# Patient Record
Sex: Female | Born: 1990 | Race: Black or African American | Hispanic: No | Marital: Single | Smoking: Never smoker
Health system: Southern US, Community
[De-identification: ages and names within clinical notes are randomized; demographics above are authoritative.]

---
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
## 2012-07-20 IMAGING — CR DG CHEST 2V
2 series · 2 of 2 positions shown · non-contrast
Comparison: None

CLINICAL DATA: Cough, fever, chest pain.

CHEST - 2 VIEW

[w chest pa]
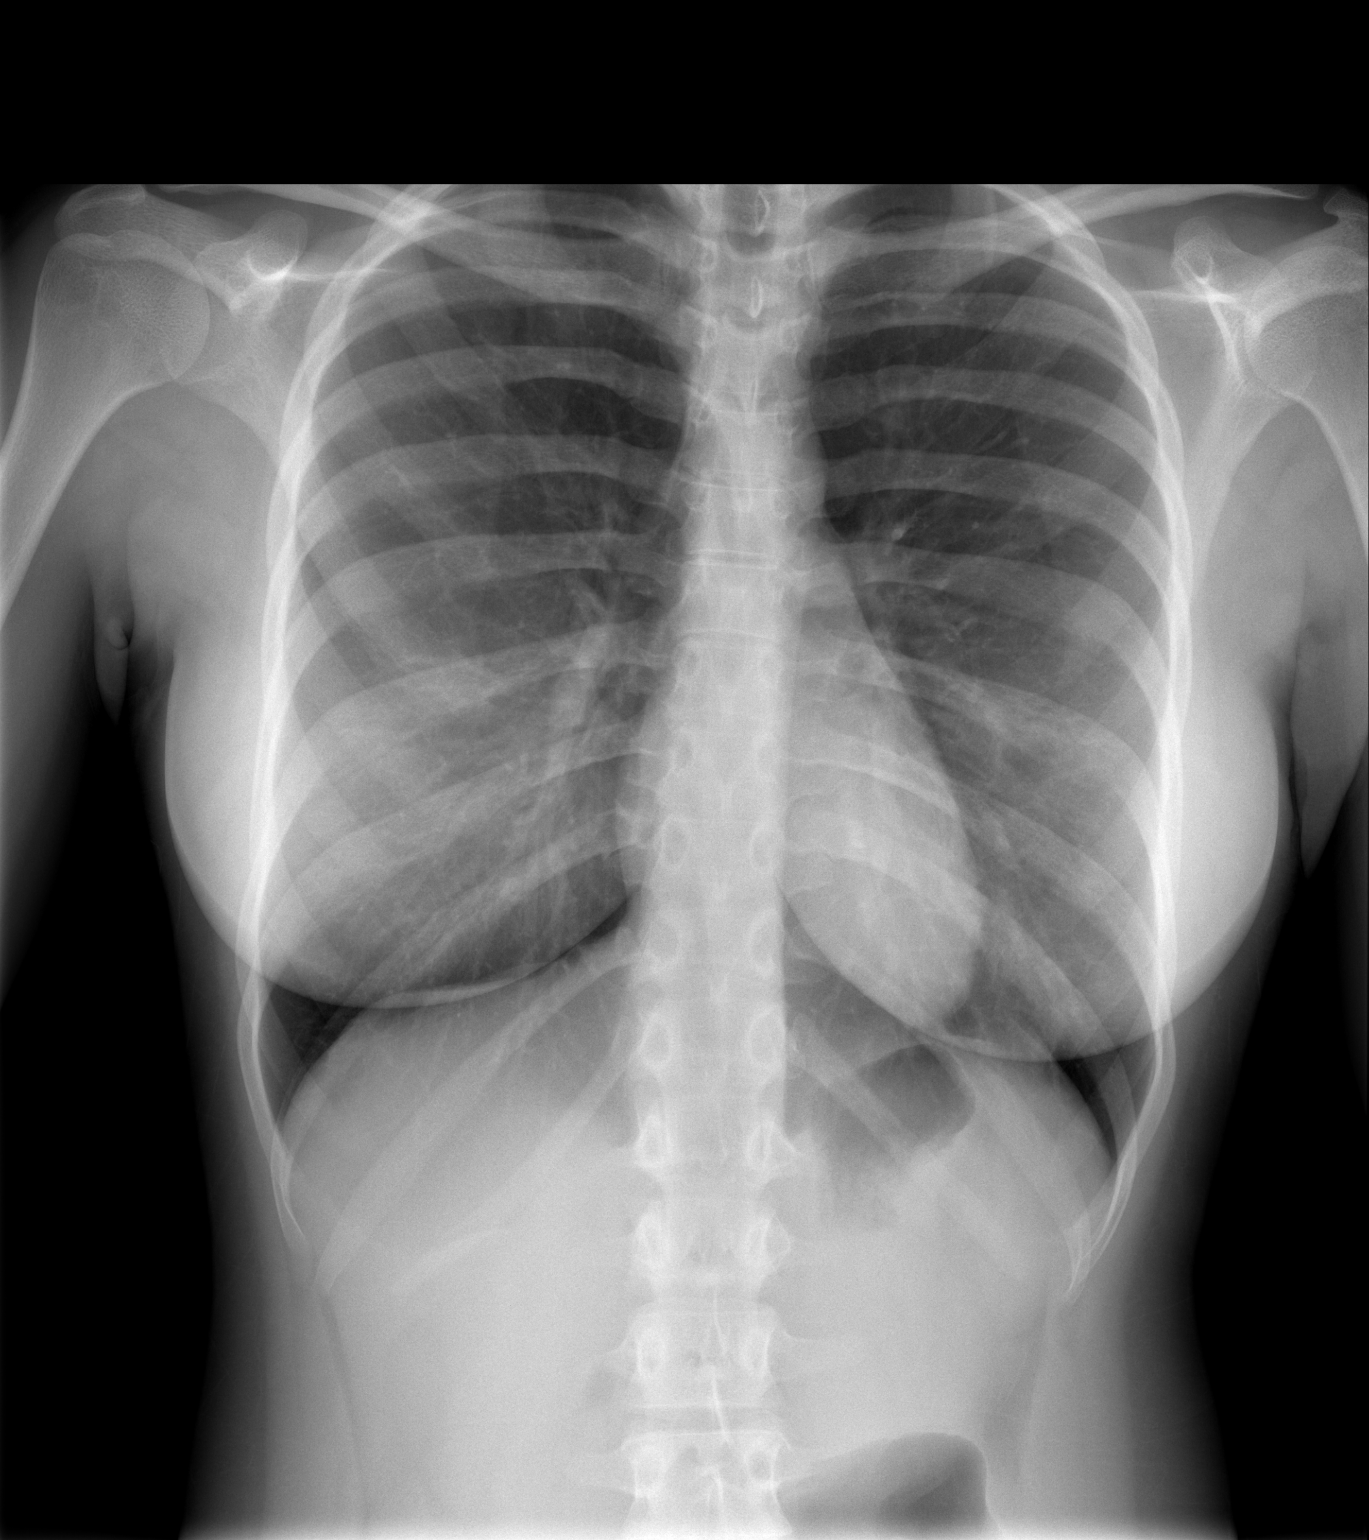

[w chest lat]
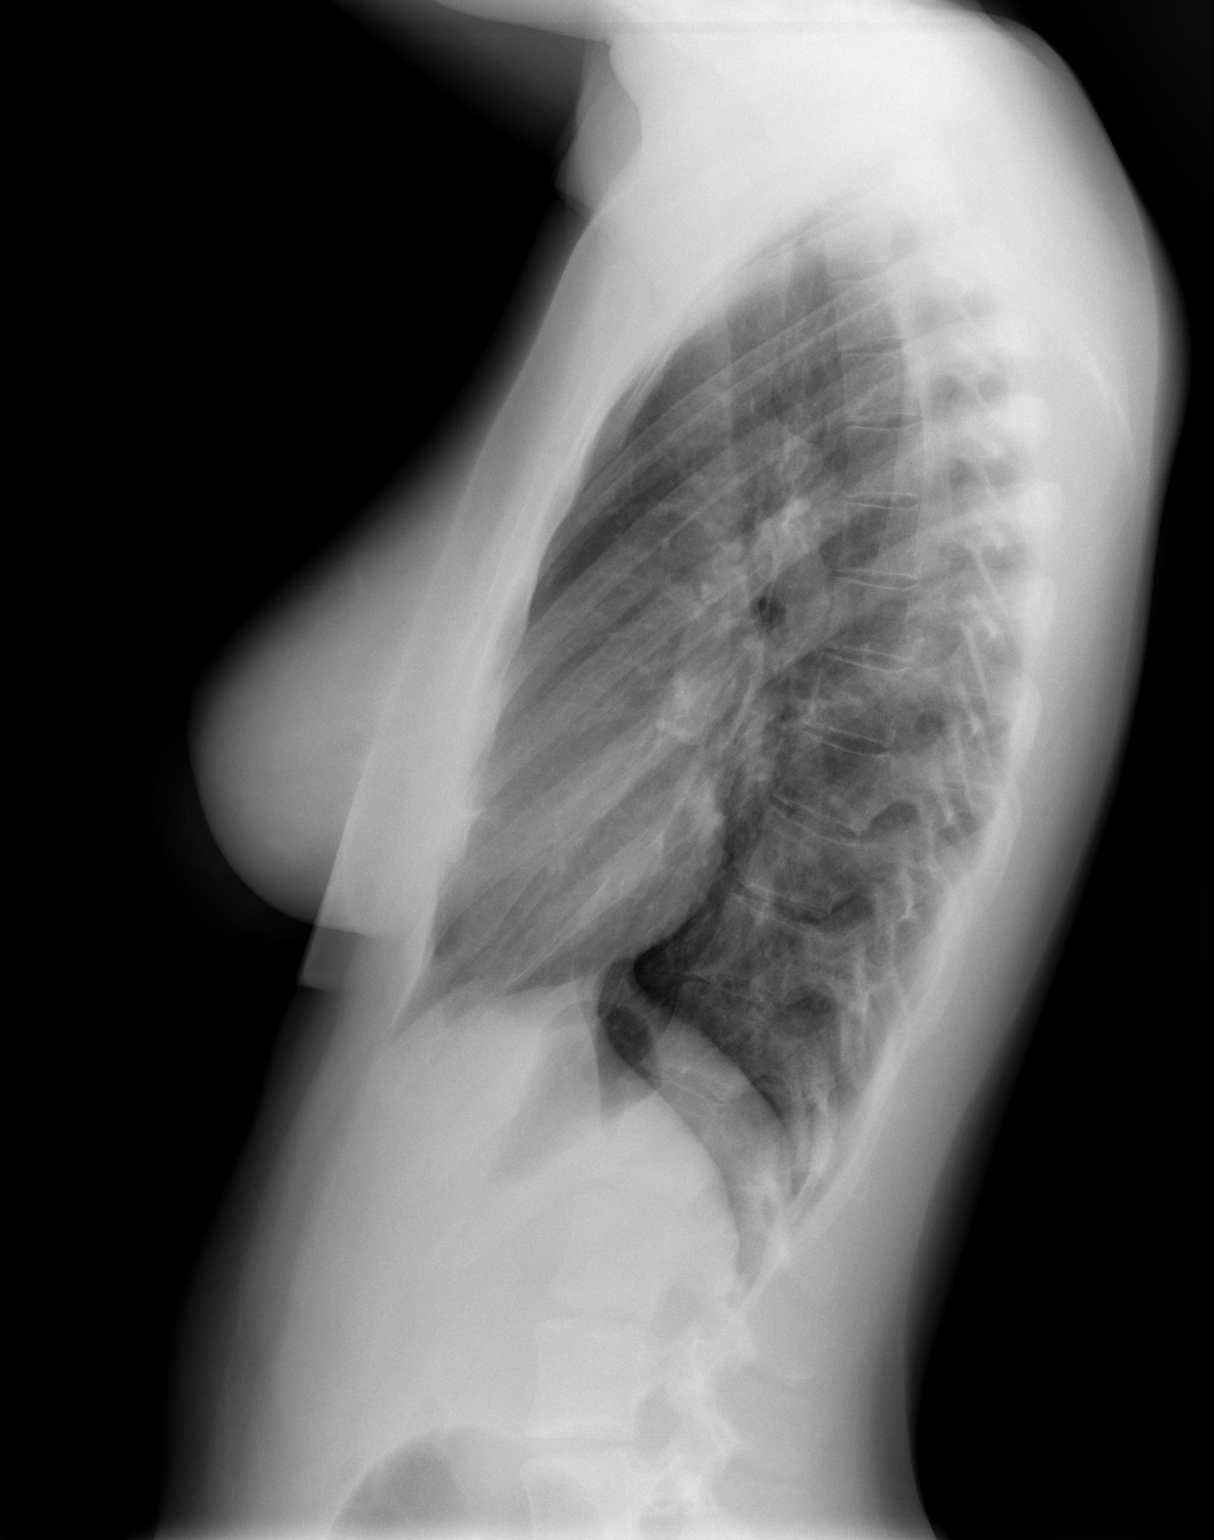

[2 of 2 positions shown; findings below may reference images not displayed]

FINDINGS: Heart and mediastinal contours are within normal limits.
No focal opacities or effusions.  No acute bony abnormality.
IMPRESSION: No active cardiopulmonary disease.

## 2016-07-06 ENCOUNTER — Encounter (HOSPITAL_COMMUNITY): Payer: Self-pay

## 2016-07-06 ENCOUNTER — Emergency Department (HOSPITAL_COMMUNITY)
Admission: EM | Admit: 2016-07-06 | Discharge: 2016-07-06 | Disposition: A | Discharge status: Home / Self Care | Payer: Self-pay | Attending: Emergency Medicine | Admitting: Emergency Medicine

## 2016-07-06 DIAGNOSIS — J069 Acute upper respiratory infection, unspecified: Secondary | ICD-10-CM | POA: Insufficient documentation

## 2016-07-06 LAB — RAPID STREP SCREEN (MED CTR MEBANE ONLY): Streptococcus, Group A Screen (Direct): NEGATIVE

## 2016-07-06 MED ORDER — IBUPROFEN 800 MG PO TABS: 800.0000 mg | ORAL_TABLET | Freq: Three times a day (TID) | ORAL | 0 refills | Status: AC

## 2016-07-06 MED ORDER — SODIUM CHLORIDE 0.9 % IV BOLUS (SEPSIS): 1000.0000 mL | Freq: Once | INTRAVENOUS | Status: DC

## 2016-07-06 MED ORDER — BENZONATATE 100 MG PO CAPS: 100.0000 mg | ORAL_CAPSULE | Freq: Three times a day (TID) | ORAL | 0 refills | Status: AC

## 2016-07-06 MED ORDER — KETOROLAC TROMETHAMINE 60 MG/2ML IM SOLN
60.0000 mg | Freq: Once | INTRAMUSCULAR | Status: AC
  Administered 2016-07-06: 60 mg via INTRAMUSCULAR
  Filled 2016-07-06: qty 2

## 2016-07-06 NOTE — Discharge Instructions (Signed)
The strep test today was negative. Your symptoms are consistent with a viral illness. Viruses do not require antibiotics. Treatment is symptomatic care and it is important to note that these symptoms may last for 7-14 days. Symptoms will be intensified and complicated by dehydration. Dehydration can also extend the duration of symptoms. Drink plenty of fluids and get plenty of rest. You should be drinking at least half a liter of water an hour to stay hydrated. Electrolyte drinks are also encouraged. You should be drinking enough fluids to make your urine light yellow, almost clear. If this is not the case, you are not drinking enough water. Ibuprofen, Naproxen, or Tylenol for pain or fever. Tessalon for cough. Plain Mucinex may help relieve congestion. Saline sinus rinses and saline nasal sprays may also help relieve congestion. Warm liquids or Chloraseptic spray may help soothe a sore throat. Follow up with a primary care provider, as needed, for any future management of this issue.

## 2016-07-06 NOTE — ED Provider Notes (Signed)
[facility]-EMERGENCY DEPT Provider Note   CSN: [id number] Arrival date & time: 07/06/16  1157     History   Chief Complaint Chief Complaint  Patient presents with  . Sore Throat    HPI Lindsay Conley is a 26 y.o. female.  HPI   Lindsay Conley is a 26 y.o. female, patient with no pertinent past medical history, presenting to the ED with sore throat and nonproductive cough for the last week. Pain is 6/10, burning/itching, nonradiating. Has tried DayQuil and NyQuil without relief. Poor fluid intake. Denies fever, difficulty breathing/swallowing, N/V/D, chest pain, or any other complaints.       History reviewed. No pertinent past medical history.  There are no active problems to display for this patient.   History reviewed. No pertinent surgical history.  OB History    No data available       Home Medications    Prior to Admission medications   Medication Sig Start Date End Date Taking? Authorizing Provider  diphenhydrAMINE (BENADRYL) 25 MG tablet Take 25 mg by mouth every 6 (six) hours as needed for itching or allergies.   Yes Historical Provider, MD  DM-Doxylamine-Acetaminophen (NYQUIL COLD & FLU PO) Take 1 capsule by mouth at bedtime as needed (pain).   Yes Historical Provider, MD  ibuprofen (ADVIL,MOTRIN) 200 MG tablet Take 400 mg by mouth 3 (three) times daily as needed. For fever    Yes Historical Provider, MD  Pseudoephedrine-APAP-DM (DAYQUIL MULTI-SYMPTOM COLD/FLU PO) Take 1 capsule by mouth daily as needed (cold symptoms).   Yes Historical Provider, MD  benzonatate (TESSALON) 100 MG capsule Take 1 capsule (100 mg total) by mouth every 8 (eight) hours. 07/06/16   Shawn C Joy, PA-C  ibuprofen (ADVIL,MOTRIN) 800 MG tablet Take 1 tablet (800 mg total) by mouth 3 (three) times daily. 07/06/16   Anselm PancoastShawn C Joy, PA-C    Family History No family history on file.  Social History Social History  Substance Use Topics  . Smoking status: Never Smoker  . Smokeless tobacco:  Never Used  . Alcohol use No     Allergies   Patient has no known allergies.   Review of Systems Review of Systems  Constitutional: Negative for fever.  HENT: Positive for sore throat. Negative for trouble swallowing and voice change.   Respiratory: Positive for cough. Negative for shortness of breath.   Cardiovascular: Negative for chest pain.  Gastrointestinal: Negative for abdominal pain, diarrhea, nausea and vomiting.  Musculoskeletal: Negative for myalgias.  All other systems reviewed and are negative.    Physical Exam Updated Vital Signs BP 127/93 (BP Location: Left Arm)   Pulse 115   Temp 99.2 F (37.3 C) (Oral)   Resp 18   LMP 06/22/2016   SpO2 99%   Physical Exam  Constitutional: She appears well-developed and well-nourished. No distress.  HENT:  Head: Normocephalic and atraumatic.  Mouth/Throat: Uvula is midline and mucous membranes are normal. Posterior oropharyngeal edema and posterior oropharyngeal erythema present.  Eyes: Conjunctivae are normal.  Neck: Neck supple.  Cardiovascular: Regular rhythm, normal heart sounds and intact distal pulses.  Tachycardia present.   Pulmonary/Chest: Effort normal and breath sounds normal. No respiratory distress.  Abdominal: Soft. There is no tenderness. There is no guarding.  Musculoskeletal: She exhibits no edema.  Lymphadenopathy:    She has no cervical adenopathy.  Neurological: She is alert.  Skin: Skin is warm and dry. She is not diaphoretic.  Psychiatric: She has a normal mood and affect. Her behavior is  normal.  Nursing note and vitals reviewed.    ED Treatments / Results  Labs (all labs ordered are listed, but only abnormal results are displayed) Labs Reviewed  RAPID STREP SCREEN (NOT AT [facility])  CULTURE, GROUP A STREP [facility])    EKG  EKG Interpretation None       Radiology No results found.  Procedures Procedures (including critical care time)  Medications Ordered in ED Medications    ketorolac (TORADOL) injection 60 mg (60 mg Intramuscular Given 07/06/16 1310)     Initial Impression / Assessment and Plan / ED Course  I have reviewed the triage vital signs and the nursing notes.  Pertinent labs & imaging results that were available during my care of the patient were reviewed by me and considered in my medical decision making (see chart for details).     Patient presents with sore throat and nonproductive cough. Nontoxic-appearing. Negative strep. Tachycardia resolved following oral hydration and anti-inflammatory administration. Continuing management and return precautions discussed. Patient voices understanding of all instructions and is comfortable with discharge.   Vitals:   07/06/16 1330 07/06/16 1345 07/06/16 1400 07/06/16 1402  BP: 116/82 105/81 122/85   Pulse: 99 98 89 95  Resp:      Temp:      TempSrc:      SpO2: 100% 96% 100% 100%    Final Clinical Impressions(s) / ED Diagnoses   Final diagnoses:  Upper respiratory tract infection, unspecified type    New Prescriptions Discharge Medication List as of 07/06/2016  2:06 PM    START taking these medications   Details  benzonatate (TESSALON) 100 MG capsule Take 1 capsule (100 mg total) by mouth every 8 (eight) hours., Starting Fri 07/06/2016, Print    !! ibuprofen (ADVIL,MOTRIN) 800 MG tablet Take 1 tablet (800 mg total) by mouth 3 (three) times daily., Starting Fri 07/06/2016, Print     !! - Potential duplicate medications found. Please discuss with provider.       Anselm Pancoast, PA-C 07/08/16 1020    Shaune Pollack, MD 07/08/16 1025

## 2016-07-06 NOTE — ED Notes (Signed)
Pt set as acuity 3 due to tachycardia of 120

## 2016-07-06 NOTE — ED Triage Notes (Signed)
Pt reports sore throat and dry cough that started about a week ago. Denies any other pain or symptoms.

## 2016-07-07 LAB — CULTURE, GROUP A STREP (THRC)
